# Patient Record
Sex: Female | Born: 2000 | Race: Black or African American | Hispanic: No | Marital: Single | Smoking: Never smoker
Health system: Southern US, Community
[De-identification: ages and names within clinical notes are randomized; demographics above are authoritative.]

---
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
## 2022-01-04 DIAGNOSIS — Z114 Encounter for screening for human immunodeficiency virus [HIV]: Secondary | ICD-10-CM | POA: Diagnosis not present

## 2022-01-04 DIAGNOSIS — A530 Latent syphilis, unspecified as early or late: Secondary | ICD-10-CM | POA: Diagnosis not present

## 2022-01-04 DIAGNOSIS — N760 Acute vaginitis: Secondary | ICD-10-CM | POA: Diagnosis not present

## 2022-01-04 DIAGNOSIS — Z113 Encounter for screening for infections with a predominantly sexual mode of transmission: Secondary | ICD-10-CM | POA: Diagnosis not present

## 2022-01-14 DIAGNOSIS — A530 Latent syphilis, unspecified as early or late: Secondary | ICD-10-CM | POA: Diagnosis not present

## 2022-01-21 DIAGNOSIS — A530 Latent syphilis, unspecified as early or late: Secondary | ICD-10-CM | POA: Diagnosis not present

## 2022-02-27 DIAGNOSIS — Z114 Encounter for screening for human immunodeficiency virus [HIV]: Secondary | ICD-10-CM | POA: Diagnosis not present

## 2022-02-27 DIAGNOSIS — Z113 Encounter for screening for infections with a predominantly sexual mode of transmission: Secondary | ICD-10-CM | POA: Diagnosis not present

## 2022-05-03 DIAGNOSIS — Z114 Encounter for screening for human immunodeficiency virus [HIV]: Secondary | ICD-10-CM | POA: Diagnosis not present

## 2022-05-03 DIAGNOSIS — Z113 Encounter for screening for infections with a predominantly sexual mode of transmission: Secondary | ICD-10-CM | POA: Diagnosis not present

## 2022-05-03 DIAGNOSIS — Z3202 Encounter for pregnancy test, result negative: Secondary | ICD-10-CM | POA: Diagnosis not present

## 2022-05-03 DIAGNOSIS — N760 Acute vaginitis: Secondary | ICD-10-CM | POA: Diagnosis not present

## 2022-05-15 DIAGNOSIS — R21 Rash and other nonspecific skin eruption: Secondary | ICD-10-CM | POA: Diagnosis not present

## 2022-05-27 DIAGNOSIS — Z113 Encounter for screening for infections with a predominantly sexual mode of transmission: Secondary | ICD-10-CM | POA: Diagnosis not present

## 2022-05-27 DIAGNOSIS — Z114 Encounter for screening for human immunodeficiency virus [HIV]: Secondary | ICD-10-CM | POA: Diagnosis not present

## 2022-07-12 DIAGNOSIS — Z113 Encounter for screening for infections with a predominantly sexual mode of transmission: Secondary | ICD-10-CM | POA: Diagnosis not present

## 2022-07-12 DIAGNOSIS — N760 Acute vaginitis: Secondary | ICD-10-CM | POA: Diagnosis not present

## 2022-07-23 DIAGNOSIS — L509 Urticaria, unspecified: Secondary | ICD-10-CM | POA: Diagnosis not present

## 2022-08-23 DIAGNOSIS — Z114 Encounter for screening for human immunodeficiency virus [HIV]: Secondary | ICD-10-CM | POA: Diagnosis not present

## 2022-08-23 DIAGNOSIS — N760 Acute vaginitis: Secondary | ICD-10-CM | POA: Diagnosis not present

## 2022-08-23 DIAGNOSIS — Z3202 Encounter for pregnancy test, result negative: Secondary | ICD-10-CM | POA: Diagnosis not present

## 2022-08-23 DIAGNOSIS — Z113 Encounter for screening for infections with a predominantly sexual mode of transmission: Secondary | ICD-10-CM | POA: Diagnosis not present

## 2022-09-25 DIAGNOSIS — R350 Frequency of micturition: Secondary | ICD-10-CM | POA: Diagnosis not present

## 2022-09-25 DIAGNOSIS — Z3202 Encounter for pregnancy test, result negative: Secondary | ICD-10-CM | POA: Diagnosis not present

## 2022-09-25 DIAGNOSIS — K5900 Constipation, unspecified: Secondary | ICD-10-CM | POA: Diagnosis not present

## 2022-10-25 DIAGNOSIS — J3081 Allergic rhinitis due to animal (cat) (dog) hair and dander: Secondary | ICD-10-CM | POA: Diagnosis not present

## 2022-10-25 DIAGNOSIS — J3089 Other allergic rhinitis: Secondary | ICD-10-CM | POA: Diagnosis not present

## 2022-10-25 DIAGNOSIS — J301 Allergic rhinitis due to pollen: Secondary | ICD-10-CM | POA: Diagnosis not present

## 2022-10-25 DIAGNOSIS — L501 Idiopathic urticaria: Secondary | ICD-10-CM | POA: Diagnosis not present

## 2023-03-25 DIAGNOSIS — N760 Acute vaginitis: Secondary | ICD-10-CM | POA: Diagnosis not present

## 2023-03-25 DIAGNOSIS — Z3202 Encounter for pregnancy test, result negative: Secondary | ICD-10-CM | POA: Diagnosis not present

## 2023-03-25 DIAGNOSIS — Z113 Encounter for screening for infections with a predominantly sexual mode of transmission: Secondary | ICD-10-CM | POA: Diagnosis not present

## 2023-08-10 DIAGNOSIS — M79642 Pain in left hand: Secondary | ICD-10-CM | POA: Diagnosis not present

## 2023-08-10 DIAGNOSIS — S61412A Laceration without foreign body of left hand, initial encounter: Secondary | ICD-10-CM | POA: Diagnosis not present
